# Patient Record
Sex: Female | Born: 2000 | Race: White | Hispanic: No | Marital: Single | Smoking: Never smoker
Health system: Southern US, Community
[De-identification: ages and names within clinical notes are randomized; demographics above are authoritative.]

## PROBLEM LIST (undated history)

## (undated) DIAGNOSIS — R55 Syncope and collapse: Secondary | ICD-10-CM

## (undated) HISTORY — DX: Syncope and collapse: R55

---

## 2019-04-20 ENCOUNTER — Ambulatory Visit: Payer: Self-pay | Attending: Family

## 2019-04-20 DIAGNOSIS — Z23 Encounter for immunization: Secondary | ICD-10-CM

## 2019-04-20 NOTE — Progress Notes (Signed)
   Covid-19 Vaccination Clinic  Name:  Kimberly Owens    MRN: [id number] DOB: 2000/10/23  04/20/2019  [name] was observed post Covid-19 immunization for 15 minutes without incident. She was provided with Vaccine Information Sheet and instruction to access the V-Safe system.   [name] was instructed to call 911 with any severe reactions post vaccine: Marland Kitchen Difficulty breathing  . Swelling of face and throat  . A fast heartbeat  . A bad rash all over body  . Dizziness and weakness   Immunizations Administered    Name Date Dose VIS Date Route   Moderna COVID-19 Vaccine 04/20/2019 11:45 AM 0.5 mL 12/20/2018 Intramuscular   Manufacturer: Moderna   Lot: [id number]   NDC: 59163-846-65

## 2019-05-23 ENCOUNTER — Ambulatory Visit: Payer: Self-pay | Attending: Family

## 2019-05-23 DIAGNOSIS — Z23 Encounter for immunization: Secondary | ICD-10-CM

## 2019-05-23 NOTE — Progress Notes (Signed)
   Covid-19 Vaccination Clinic  Name:  Kimberly Owens    MRN: [id number] DOB: 23-Jan-2000  05/23/2019  Ms. Kimberly Owens was observed post Covid-19 immunization for 15 minutes without incident. She was provided with Vaccine Information Sheet and instruction to access the V-Safe system.   Ms. Kimberly Owens was instructed to call 911 with any severe reactions post vaccine: Marland Kitchen Difficulty breathing  . Swelling of face and throat  . A fast heartbeat  . A bad rash all over body  . Dizziness and weakness   Immunizations Administered    Name Date Dose VIS Date Route   Moderna COVID-19 Vaccine 05/23/2019 11:28 AM 0.5 mL 12/2018 Intramuscular   Manufacturer: Moderna   Lot: [id number]   NDC: 78412-820-81

## 2020-02-08 ENCOUNTER — Emergency Department (HOSPITAL_COMMUNITY): Payer: Managed Care, Other (non HMO)

## 2020-02-08 ENCOUNTER — Other Ambulatory Visit: Payer: Self-pay

## 2020-02-08 ENCOUNTER — Encounter (HOSPITAL_COMMUNITY): Payer: Self-pay

## 2020-02-08 ENCOUNTER — Emergency Department (HOSPITAL_COMMUNITY)
Admission: EM | Admit: 2020-02-08 | Discharge: 2020-02-09 | Disposition: A | Discharge status: Home / Self Care | Payer: Managed Care, Other (non HMO) | Attending: Emergency Medicine | Admitting: Emergency Medicine

## 2020-02-08 DIAGNOSIS — R079 Chest pain, unspecified: Secondary | ICD-10-CM | POA: Diagnosis present

## 2020-02-08 DIAGNOSIS — I319 Disease of pericardium, unspecified: Secondary | ICD-10-CM

## 2020-02-08 LAB — CBC
HCT: 42.4 % (ref 36.0–46.0)
Hemoglobin: 14.3 g/dL (ref 12.0–15.0)
MCH: 30.1 pg (ref 26.0–34.0)
MCHC: 33.7 g/dL (ref 30.0–36.0)
MCV: 89.3 fL (ref 80.0–100.0)
Platelets: 365 10*3/uL (ref 150–400)
RBC: 4.75 MIL/uL (ref 3.87–5.11)
RDW: 12.4 % (ref 11.5–15.5)
WBC: 7.3 10*3/uL (ref 4.0–10.5)
nRBC: 0 % (ref 0.0–0.2)

## 2020-02-08 LAB — BASIC METABOLIC PANEL
Anion gap: 9 (ref 5–15)
BUN: 18 mg/dL (ref 6–20)
CO2: 23 mmol/L (ref 22–32)
Calcium: 9.3 mg/dL (ref 8.9–10.3)
Chloride: 104 mmol/L (ref 98–111)
Creatinine, Ser: 0.73 mg/dL (ref 0.44–1.00)
GFR, Estimated: >60 mL/min (ref >60)
Glucose, Bld: 76 mg/dL (ref 70–99)
Potassium: 3.8 mmol/L (ref 3.5–5.1)
Sodium: 136 mmol/L (ref 135–145)

## 2020-02-08 LAB — I-STAT BETA HCG BLOOD, ED (MC, WL, AP ONLY): I-stat hCG, quantitative: <5 m[IU]/mL (ref <5)

## 2020-02-08 MED ORDER — ALBUTEROL SULFATE HFA 108 (90 BASE) MCG/ACT IN AERS: 2.0000 | INHALATION_SPRAY | RESPIRATORY_TRACT | Status: DC | PRN

## 2020-02-08 MED ORDER — IOHEXOL 350 MG/ML SOLN
87.0000 mL | Freq: Once | INTRAVENOUS | Status: AC | PRN
  Administered 2020-02-08: 87 mL via INTRAVENOUS

## 2020-02-08 NOTE — ED Triage Notes (Signed)
Pt c/o chest pressure after being sick for approx 86mo, symptoms getting better/worse over timespan. Taken several covid tests, all negative. Sx include fatigue, nausea, HA, fevers, cough, runny/stuffy nose. Medicated at home using OTC medications. Pt is vaccinated.

## 2020-02-09 LAB — TROPONIN I (HIGH SENSITIVITY)
Troponin I (High Sensitivity): <2 ng/L (ref <18)
Troponin I (High Sensitivity): <2 ng/L (ref <18)

## 2020-02-09 MED ORDER — MELOXICAM 7.5 MG PO TABS: 7.5000 mg | ORAL_TABLET | Freq: Every day | ORAL | 0 refills | Status: DC

## 2020-02-09 NOTE — ED Provider Notes (Signed)
Contacted by Dr. Elvera Maria, radiologist.  Patient is currently in the waiting room.  She had a CT of her chest performed.  There is no PE seen but she does have what appears to be mediastinum.  Dr. Debria Garret is concerned that the air may be in the pericardium.  Differential diagnosis would be pneumomediastinum secondary to her persistent cough but must consider pericarditis.  EKG reviewed, no changes to suggest pericarditis.  Will add on troponins to rule out myocarditis.   EKG Interpretation  Date/Time:  Thursday February 08 2020 21:57:15 EST Ventricular Rate:  97 PR Interval:  146 QRS Duration: 88 QT Interval:  344 QTC Calculation: 436 R Axis:   57 Text Interpretation: Normal sinus rhythm Normal ECG Confirmed by Gilda Crease [phone]) on 02/09/2020 12:01:29 AM         Gilda Crease, MD 02/09/20 0003

## 2020-02-09 NOTE — ED Notes (Signed)
ED Provider at bedside. 

## 2020-02-09 NOTE — ED Provider Notes (Signed)
[facility] EMERGENCY DEPARTMENT Provider Note   CSN: [id number] Arrival date & time: 02/08/20  2147     History Chief Complaint  Patient presents with   Chest Pain    Kimberly Owens is a 20 y.o. female.  Has had a URI type illness for the last couple weeks.  She had on and off again symptoms that started doing better.  Started persistent cough and runny nose.  She yesterday she had acute onset of chest pain hasn't gotten any better.  She elevated shortness of breath a few days ago that comes and goes as well.  She has been afebrile recently.  Her mother stated that she should get checked out for the chest pain.  She had multiple COVID test that were negative.  No other associated symptoms.  No lower extremity swelling.  No syncope.   Chest Pain      History reviewed. No pertinent past medical history.  There are no problems to display for this patient.   History reviewed. No pertinent surgical history.   OB History   No obstetric history on file.     No family history on file.     Home Medications Prior to Admission medications   Medication Sig Start Date End Date Taking? Authorizing Provider  meloxicam (MOBIC) 7.5 MG tablet Take 1 tablet (7.5 mg total) by mouth daily. 02/09/20  Yes Tanner Vigna, Barbara Cower, MD    Allergies    Patient has no allergy information on record.  Review of Systems   Review of Systems  Cardiovascular: Positive for chest pain.    Physical Exam Updated Vital Signs BP 122/80    Pulse 77    Temp 98.2 F (36.8 C) (Oral)    Resp 20    SpO2 99%   Physical Exam Vitals and nursing note reviewed.  Constitutional:      Appearance: She is well-developed and well-nourished.  HENT:     Head: Normocephalic and atraumatic.     Nose: No congestion or rhinorrhea.     Mouth/Throat:     Mouth: Mucous membranes are moist.     Pharynx: Oropharynx is clear.  Eyes:     Pupils: Pupils are equal, round, and reactive to light.   Cardiovascular:     Rate and Rhythm: Normal rate and regular rhythm.  Pulmonary:     Effort: Pulmonary effort is normal. No respiratory distress.     Breath sounds: No stridor.  Abdominal:     General: Abdomen is flat. There is no distension.  Musculoskeletal:     Cervical back: Normal range of motion.  Skin:    General: Skin is warm and dry.     Coloration: Skin is not jaundiced or pale.  Neurological:     General: No focal deficit present.     Mental Status: She is alert.     ED Results / Procedures / Treatments   Labs (all labs ordered are listed, but only abnormal results are displayed) Labs Reviewed  BASIC METABOLIC PANEL  CBC  I-STAT BETA HCG BLOOD, ED ([facility], [facility], [facility] ONLY)  TROPONIN I (HIGH SENSITIVITY)  TROPONIN I (HIGH SENSITIVITY)    EKG EKG Interpretation  Date/Time:  Thursday February 08 2020 21:57:15 EST Ventricular Rate:  97 PR Interval:  146 QRS Duration: 88 QT Interval:  344 QTC Calculation: 436 R Axis:   57 Text Interpretation: Normal sinus rhythm Normal ECG Confirmed by Gilda Crease [phone]) on 02/09/2020 12:01:29 AM   Radiology DG  Chest 2 View  Addendum Date: 02/08/2020   ADDENDUM REPORT: 02/08/2020 22:17 ADDENDUM: Critical Value/emergent results were called by telephone at the time of physician contact on 02/08/2020 at 10:17 pm to provider ADAM CURATOLO , who verbally acknowledged these results. Electronically Signed   By: Kreg Shropshire M.D.   On: 02/08/2020 22:17   Result Date: 02/08/2020 CLINICAL DATA:  Chest pressure after illness for 1 month EXAM: CHEST - 2 VIEW COMPARISON:  None. FINDINGS: No consolidation, features of edema, pneumothorax, or effusion. Lucency along the left heart border concerning for small volume pneumopericardium, indeterminate etiology. Remaining cardiomediastinal contours are unremarkable. No acute osseous or soft tissue abnormality. IMPRESSION: Lucency along the left heart border concerning for small volume  pneumopericardium, indeterminate etiology. Recommend correlation with chest CT. Electronically Signed: By: Kreg Shropshire M.D. On: 02/08/2020 22:14   CT Angio Chest PE W and/or Wo Contrast  Addendum Date: 02/09/2020   ADDENDUM REPORT: 02/09/2020 00:01 ADDENDUM: These results were called by telephone on 02/09/2020 at 12:00 am to provider Dr. Blinda Leatherwood, who verbally acknowledged these results. Electronically Signed   By: Kreg Shropshire M.D.   On: 02/09/2020 00:01   Result Date: 02/09/2020 CLINICAL DATA:  Pneumopericardium, 1 month of generalized illness, multiple negative COVID-19 tests is EXAM: CT ANGIOGRAPHY CHEST WITH CONTRAST TECHNIQUE: Multidetector CT imaging of the chest was performed using the standard protocol during bolus administration of intravenous contrast. Multiplanar CT image reconstructions and MIPs were obtained to evaluate the vascular anatomy. CONTRAST:  26mL OMNIPAQUE IOHEXOL 350 MG/ML SOLN COMPARISON:  Radiograph 02/08/2020 FINDINGS: Cardiovascular: Satisfactory opacification the pulmonary arteries to the segmental level. No pulmonary artery filling defects are identified. Central pulmonary arteries are normal caliber. Air lucencies are noted in the anti dependent portions of pericardium and pericardial recesses. Few in turn a thin septations are present as well. No large volume pericardial effusion. No other acute cardiac abnormality is seen. No acute luminal abnormality of the imaged aorta. No periaortic stranding or hemorrhage. Mediastinum/Nodes: Wedge-shaped soft tissue in the anterior mediastinum, favored to reflect thymic remnant, appears separate the pericardial process. Gas appears contained within the pericardium rather than freely within the mediastinum. No acute abnormality of the esophagus or trachea. No concerning mediastinal mild hilar or axillary adenopathy. Lungs/Pleura: No consolidation, features of edema, pneumothorax, or effusion. No suspicious pulmonary nodules or masses. Upper  Abdomen: No acute abnormalities present in the visualized portions of the upper abdomen. Musculoskeletal: No acute osseous abnormality or suspicious osseous lesion. No chest wall mass or suspicious bone lesions identified. Review of the MIP images confirms the above findings. IMPRESSION: 1. No evidence of pulmonary artery embolism. 2. Air lucencies in the anti dependent portions of pericardium and pericardial recesses. Few thin internal septations may be present as well with at most trace pericardial fluid. While this could reflect spontaneous pneumopericardium which can be seen in the setting of respiratory illness and aggressive coughing as the result of a Macklin effect, pericarditis/pericardial infection should be excluded even though this would be an atypical presentation. 3. Wedge-shaped soft tissue in the anterior mediastinum, favored to reflect thymic remnant, appears separate the pericardial process. Currently attempting to contact the ordering provider with a critical value result. Addendum will be submitted upon case discussion. Electronically Signed: By: Kreg Shropshire M.D. On: 02/08/2020 23:55    Procedures Procedures (including critical care time)  Medications Ordered in ED Medications  albuterol (VENTOLIN HFA) 108 (90 Base) MCG/ACT inhaler 2 puff (has no administration in time range)  iohexol (OMNIPAQUE)  350 MG/ML injection 87 mL (87 mLs Intravenous Contrast Given 02/08/20 2339)    ED Course  I have reviewed the triage vital signs and the nursing notes.  Pertinent labs & imaging results that were available during my care of the patient were reviewed by me and considered in my medical decision making (see chart for details).    MDM Rules/Calculators/A&P                          Found a pneumomediastinum and pneumopericardium.  This is likely related to forceful coughing.  I discussed with cardiothoracic surgery, Dr. Renaldo Fiddler, who stated if there is no other indication for admission to  the hospital that she can be followed up by primary doctor.  If any worsening symptoms then she can return here.  Did not suggest antibiotics.  Patient's vital signs are within normal limits.  Her lungs are clear.  Her heart sounds are clear.  I don't see any reason for admission to the hospital at this time.  We'll start her on some anti-inflammatories have her follow-up with her PCP.  Discussed return precautions.  Final Clinical Impression(s) / ED Diagnoses Final diagnoses:  Pneumopericardium    Rx / DC Orders ED Discharge Orders         Ordered    meloxicam (MOBIC) 7.5 MG tablet  Daily        02/09/20 0236           Manna Gose, Barbara Cower, MD 02/09/20 [phone]

## 2020-06-04 ENCOUNTER — Ambulatory Visit (HOSPITAL_COMMUNITY)
Admission: EM | Admit: 2020-06-04 | Discharge: 2020-06-04 | Disposition: A | Discharge status: Other Acute Inpatient Hospital | Payer: Managed Care, Other (non HMO)

## 2020-06-04 ENCOUNTER — Encounter (HOSPITAL_COMMUNITY): Payer: Self-pay | Admitting: Emergency Medicine

## 2020-06-04 ENCOUNTER — Other Ambulatory Visit: Payer: Self-pay

## 2020-06-04 ENCOUNTER — Emergency Department (HOSPITAL_COMMUNITY)
Admission: EM | Admit: 2020-06-04 | Discharge: 2020-06-05 | Disposition: A | Discharge status: Home / Self Care | Payer: Managed Care, Other (non HMO) | Attending: Emergency Medicine | Admitting: Emergency Medicine

## 2020-06-04 DIAGNOSIS — R011 Cardiac murmur, unspecified: Secondary | ICD-10-CM | POA: Diagnosis not present

## 2020-06-04 DIAGNOSIS — R55 Syncope and collapse: Secondary | ICD-10-CM | POA: Insufficient documentation

## 2020-06-04 LAB — CBC
HCT: 46.4 % — ABNORMAL HIGH (ref 36.0–46.0)
Hemoglobin: 15.6 g/dL — ABNORMAL HIGH (ref 12.0–15.0)
MCH: 31.3 pg (ref 26.0–34.0)
MCHC: 33.6 g/dL (ref 30.0–36.0)
MCV: 93.2 fL (ref 80.0–100.0)
Platelets: 385 10*3/uL (ref 150–400)
RBC: 4.98 MIL/uL (ref 3.87–5.11)
RDW: 12.7 % (ref 11.5–15.5)
WBC: 15.2 10*3/uL — ABNORMAL HIGH (ref 4.0–10.5)
nRBC: 0 % (ref 0.0–0.2)

## 2020-06-04 LAB — URINALYSIS, ROUTINE W REFLEX MICROSCOPIC
Bilirubin Urine: NEGATIVE
Glucose, UA: NEGATIVE mg/dL
Hgb urine dipstick: NEGATIVE
Ketones, ur: NEGATIVE mg/dL
Leukocytes,Ua: NEGATIVE
Nitrite: NEGATIVE
Protein, ur: NEGATIVE mg/dL
Specific Gravity, Urine: 1.019 (ref 1.005–1.030)
pH: 6 (ref 5.0–8.0)

## 2020-06-04 LAB — I-STAT BETA HCG BLOOD, ED (MC, WL, AP ONLY): I-stat hCG, quantitative: <5 m[IU]/mL (ref <5)

## 2020-06-04 LAB — COMPREHENSIVE METABOLIC PANEL
ALT: 21 U/L (ref 0–44)
AST: 22 U/L (ref 15–41)
Albumin: 4.1 g/dL (ref 3.5–5.0)
Alkaline Phosphatase: 59 U/L (ref 38–126)
Anion gap: 6 (ref 5–15)
BUN: 11 mg/dL (ref 6–20)
CO2: 29 mmol/L (ref 22–32)
Calcium: 9.6 mg/dL (ref 8.9–10.3)
Chloride: 104 mmol/L (ref 98–111)
Creatinine, Ser: 0.86 mg/dL (ref 0.44–1.00)
GFR, Estimated: >60 mL/min (ref >60)
Glucose, Bld: 100 mg/dL — ABNORMAL HIGH (ref 70–99)
Potassium: 4.4 mmol/L (ref 3.5–5.1)
Sodium: 139 mmol/L (ref 135–145)
Total Bilirubin: 1.1 mg/dL (ref 0.3–1.2)
Total Protein: 7.1 g/dL (ref 6.5–8.1)

## 2020-06-04 NOTE — ED Triage Notes (Signed)
Pt was at the nail salon when she had a one minute episode of loc sitting in salon chair. First time this has ever happened. Pt woke up confused, nauseous and drenched in sweat with pins and needles all over body. Pt denies any pain. No injuries.

## 2020-06-04 NOTE — ED Notes (Signed)
Patient reports she was sitting at a nail salon having nails done when witness reported she passed out.  Patient did not hit the floor or any other surface.  Patient denies any issue. Patient denies a history of this.  Patient denies any recent illness, states she has eaten today, no significant work out today.  Patient has a friend with her.  Spoke to dr Dareen Piano.  Dr Dareen Piano spoke to patient and patient agreeable to going to ed.  Dr Dareen Piano did call ED to notify of coming

## 2020-06-04 NOTE — ED Provider Notes (Signed)
Emergency Medicine Provider Triage Evaluation Note  Kimberly Owens , a 20 y.o. female  was evaluated in triage.  Pt complains of syncope.  On 415, patient had a syncopal event although now small.  She was sitting down, feeling well with no preceding symptoms.  When she woke up, she was sweaty, nauseous, and tingly all over.  The symptoms have improved, but not completely resolved.  No previous history of syncope.  No family history of syncope with exertion.  She has no medical problems other than anxiety, takes medicine for this.  That medicine is not new nor recently changed.  She did not have any fevers, chest pain, shortness breath, cough, nausea, vomiting, abdominal pain, urinary symptoms, abnormal bowel movements.  Last period was a few weeks ago.  She has had food and drink today like normal. Patient was in the ER in January 2022 and diagnosed with pneumopericardium and pneumomediastinum.  She has had no residual chest pain, shortness of breath, cough from this.  Review of Systems  Positive: syncope Negative: Fever, cp  Physical Exam  BP 118/70 (BP Location: Left Arm)   Pulse (!) 58   Temp 97.9 F (36.6 C)   Resp 16   SpO2 100%  Gen:   Awake, no distress   Resp:  Normal effort, clear lung sounds  MSK:   Moves extremities without difficulty  Other:  No TTP of abdomen.  Regular rate rhythm.  Medical Decision Making  Medically screening exam initiated at 5:12 PM.  Appropriate orders placed.  Kimberly Owens was informed that the remainder of the evaluation will be completed by another provider, this initial triage assessment does not replace that evaluation, and the importance of remaining in the ED until their evaluation is complete.  Labs, EKG, urine ordered   Kimberly Apley, PA-C 06/04/20 1723    Blane Ohara, MD 06/05/20 [phone]

## 2020-06-04 NOTE — ED Notes (Signed)
Patient is being discharged from the Urgent Care and sent to the Emergency Department via pov . Per dr Dareen Piano, patient is in need of higher level of care due to syncope. Patient is aware and verbalizes understanding of plan of care. There were no vitals filed for this visit.

## 2020-06-05 ENCOUNTER — Emergency Department (HOSPITAL_COMMUNITY): Payer: Managed Care, Other (non HMO)

## 2020-06-05 MED ORDER — ACETAMINOPHEN 500 MG PO TABS
1000.0000 mg | ORAL_TABLET | Freq: Once | ORAL | Status: AC
  Administered 2020-06-05: 1000 mg via ORAL
  Filled 2020-06-05: qty 2

## 2020-06-05 NOTE — ED Provider Notes (Signed)
[facility] EMERGENCY DEPARTMENT Provider Note   CSN: [id number] Arrival date & time: 06/04/20  1658     History Chief Complaint  Patient presents with  . Loss of Consciousness    Kimberly Owens is a 20 y.o. female.  20 year old female without significant past medical history who presents to the emergency department today secondary to syncope.  Patient was apparently getting her fingernails and toenails painted when she had a syncopal episode.  The people there caught her and she did not fall to the ground.  They state that she was out for approximately 60 seconds but then when she awoke she felt little bit cool clammy and nauseous.  No other associated symptoms.  No preceding chest pain, palpitations, headache, blurry vision.  She is been eating and drinking normally recently.  Normal menstrual cycles.  No family history of early death or other cardiac issues.        History reviewed. No pertinent past medical history.  There are no problems to display for this patient.   History reviewed. No pertinent surgical history.   OB History   No obstetric history on file.     No family history on file.  Social History   Tobacco Use  . Smoking status: Never Smoker  . Smokeless tobacco: Never Used  Substance Use Topics  . Alcohol use: Never  . Drug use: Never    Home Medications Prior to Admission medications   Medication Sig Start Date End Date Taking? Authorizing Provider  FLUoxetine (PROZAC) 10 MG capsule Take 10 mg by mouth daily. Patient not taking: Reported on 06/05/2020 05/23/20   [provider]    Allergies    Patient has no known allergies.  Review of Systems   Review of Systems  All other systems reviewed and are negative.   Physical Exam Updated Vital Signs BP 114/66 (BP Location: Right Arm)   Pulse (!) 59   Temp (!) 97 F (36.1 C) (Temporal)   Resp 18   Ht 5\' 10"  (1.778 m)   Wt 74.8 kg   LMP 05/05/2020   SpO2 100%   BMI  23.68 kg/m   Physical Exam Vitals and nursing note reviewed.  Constitutional:      Appearance: She is well-developed.  HENT:     Head: Normocephalic and atraumatic.     Mouth/Throat:     Mouth: Mucous membranes are moist.     Pharynx: Oropharynx is clear.  Eyes:     Pupils: Pupils are equal, round, and reactive to light.  Cardiovascular:     Rate and Rhythm: Normal rate and regular rhythm.     Heart sounds: Murmur heard.    Pulmonary:     Effort: No respiratory distress.     Breath sounds: No stridor.  Abdominal:     General: Abdomen is flat. There is no distension.  Musculoskeletal:        General: No swelling, tenderness or signs of injury. Normal range of motion.     Cervical back: Normal range of motion.  Skin:    General: Skin is warm and dry.  Neurological:     General: No focal deficit present.     Mental Status: She is alert.     ED Results / Procedures / Treatments   Labs (all labs ordered are listed, but only abnormal results are displayed) Labs Reviewed  CBC - Abnormal; Notable for the following components:      Result Value   WBC  15.2 (*)    Hemoglobin 15.6 (*)    HCT 46.4 (*)    All other components within normal limits  COMPREHENSIVE METABOLIC PANEL - Abnormal; Notable for the following components:   Glucose, Bld 100 (*)    All other components within normal limits  URINALYSIS, ROUTINE W REFLEX MICROSCOPIC  I-STAT BETA HCG BLOOD, ED ([facility], [facility], [facility] ONLY)  CBG MONITORING, ED    EKG EKG Interpretation  Date/Time:  Tuesday Jun 04 2020 17:05:18 EDT Ventricular Rate:  61 PR Interval:  134 QRS Duration: 90 QT Interval:  384 QTC Calculation: 386 R Axis:   93 Text Interpretation: Normal sinus rhythm Rightward axis Borderline ECG Right axis new since january Confirmed by Marily Memos [phone]) on 06/05/2020 2:01:41 AM   Radiology CT Head Wo Contrast  Result Date: 06/05/2020 CLINICAL DATA:  Cerebral hemorrhage suspected EXAM: CT HEAD WITHOUT CONTRAST  TECHNIQUE: Contiguous axial images were obtained from the base of the skull through the vertex without intravenous contrast. COMPARISON:  None. FINDINGS: Brain: No evidence of acute infarction, hemorrhage, hydrocephalus, extra-axial collection or mass lesion/mass effect. Vascular: No hyperdense vessel or unexpected calcification. Skull: Normal. Negative for fracture or focal lesion. Sinuses/Orbits: No acute finding. Other: None. IMPRESSION: No acute intracranial pathology. Electronically Signed   By: Maudry Mayhew MD   On: 06/05/2020 03:32    Procedures Procedures   Medications Ordered in ED Medications  acetaminophen (TYLENOL) tablet 1,000 mg (1,000 mg Oral Given 06/05/20 0228)    ED Course  I have reviewed the triage vital signs and the nursing notes.  Pertinent labs & imaging results that were available during my care of the patient were reviewed by me and considered in my medical decision making (see chart for details).    MDM Rules/Calculators/A&P                          Will refer to cardiology.  Work-up here is overall unremarkable however she does appear to have a new right axis deviation on her EKG.  She has no chest pain, shortness of breath, tachycardia, tachypnea, hypoxia or other signs of pulmonary embolus.  It does seem that she has a very slight murmur which would be new.  This conjunction with the syncope I feel she needs cardiology follow-up for echocardiogram to rule out any structural causes however the syncope did sound vasovagal in nature.  She had a headache at time of evaluation so CT done without any abnormalities.  Tylenol given.  Stable for discharge with cardiology follow-up.  All questions answered.  Strict return precautions.  Final Clinical Impression(s) / ED Diagnoses Final diagnoses:  Syncope, unspecified syncope type  Heart murmur    Rx / DC Orders ED Discharge Orders         Ordered    Ambulatory referral to Cardiology        06/05/20 0341            Shalina Norfolk, Barbara Cower, MD 06/05/20 [phone]

## 2020-06-05 NOTE — ED Notes (Signed)
Provider bedside.

## 2020-06-05 NOTE — ED Notes (Addendum)
Pt back from CT, ambulated to bathroom w/o any issues at this time.

## 2020-08-20 ENCOUNTER — Encounter: Payer: Self-pay | Admitting: Cardiovascular Disease

## 2020-08-20 NOTE — Progress Notes (Signed)
This encounter was created in error - please disregard.

## 2020-08-21 ENCOUNTER — Encounter: Payer: Managed Care, Other (non HMO) | Admitting: Cardiovascular Disease

## 2022-09-23 IMAGING — CT CT HEAD W/O CM
4 series · 17 of 47 positions shown, 19 images · non-contrast
Comparison: None.

CLINICAL DATA: Cerebral hemorrhage suspected

EXAM:
CT HEAD WITHOUT CONTRAST
TECHNIQUE: Contiguous axial images were obtained from the base of the skull
through the vertex without intravenous contrast.

[Series 3: head bone · axial · 0.45mm/px · z∈[-153,-95]mm · 4 of 85 slices shown]
[im 9/85  bone]
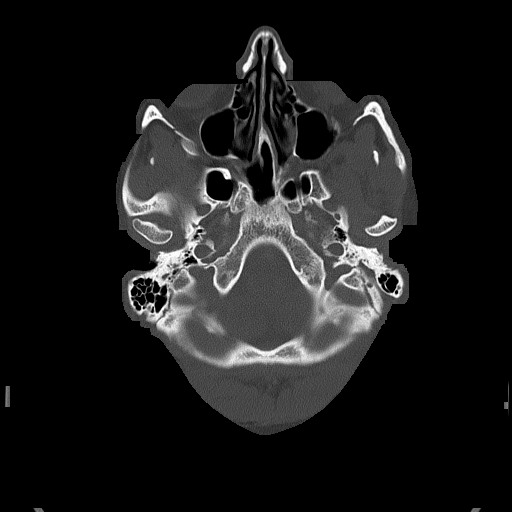
[im 17/85  bone]
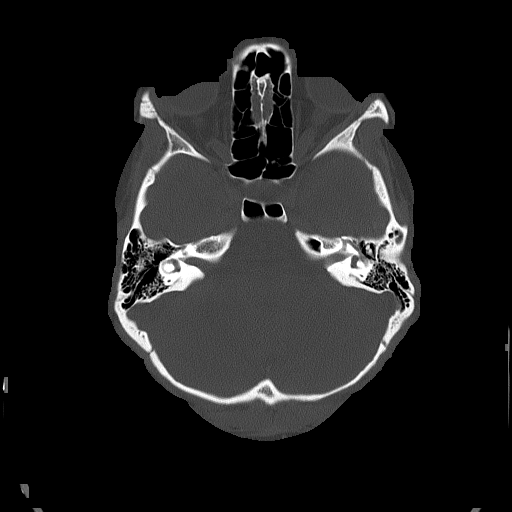
[im 26/85  bone]
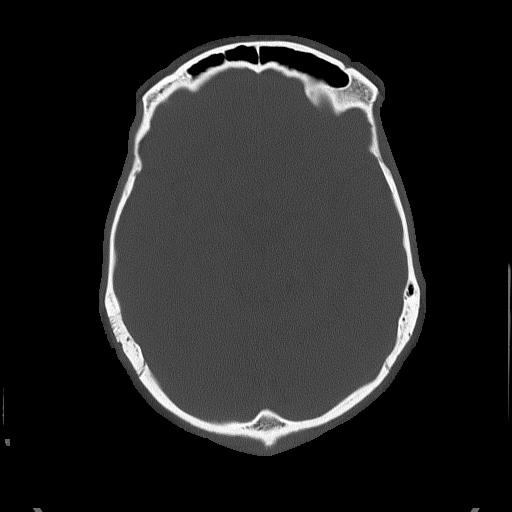
[im 38/85  bone]
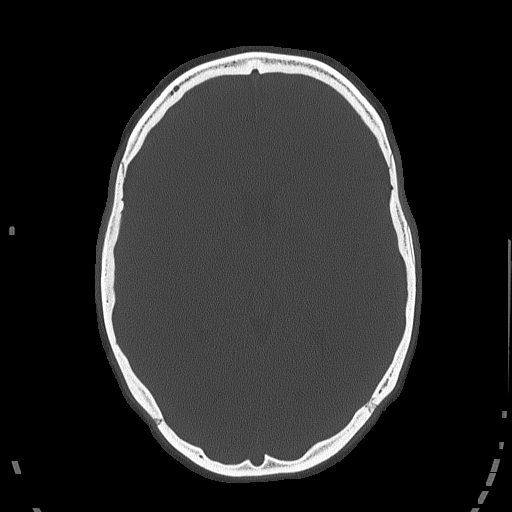

[Series 4: head without · axial · non-contrast · 0.45mm/px · z∈[-149,-29]mm · 7 of 34 slices shown, 9 images]
[im 5/34  brain]
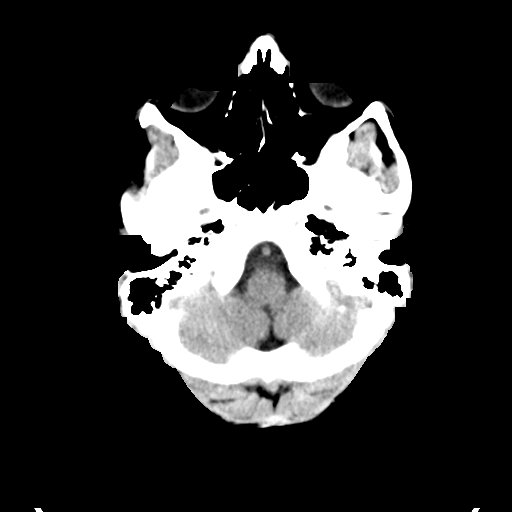
[im 5/34  bone]
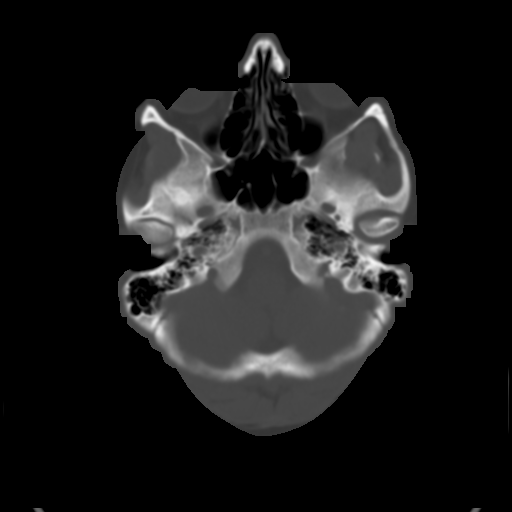
[im 9/34  brain]
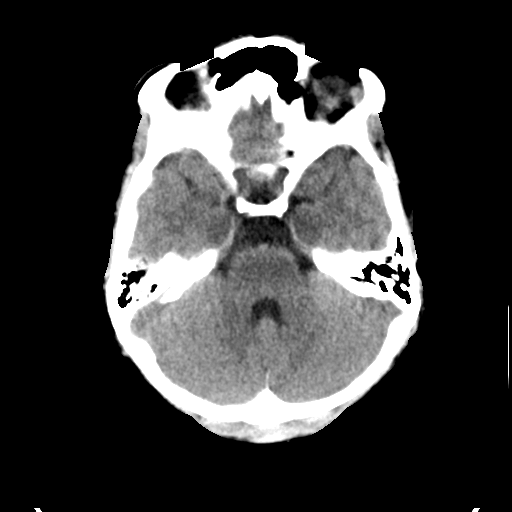
[im 13/34  brain]
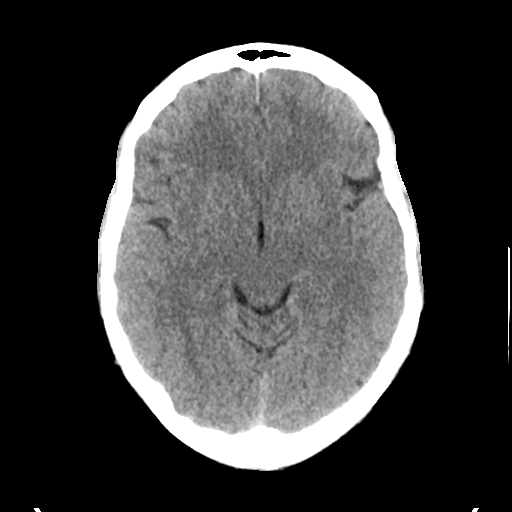
[im 17/34  brain]
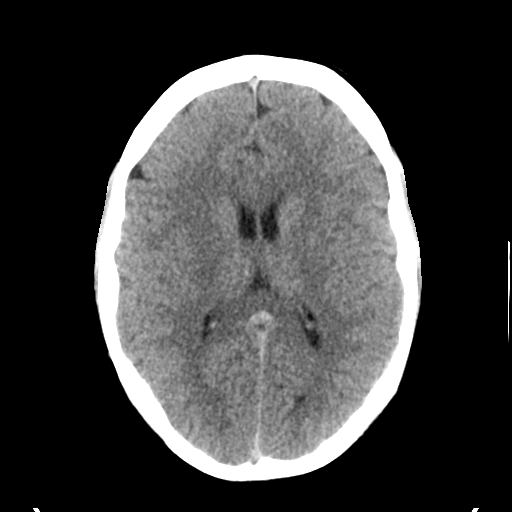
[im 21/34  brain]
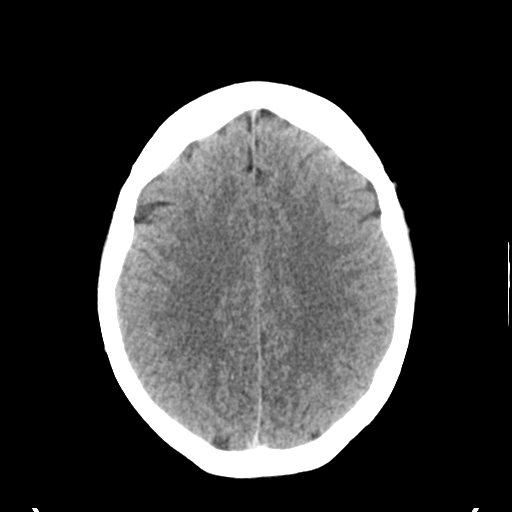
[im 21/34  bone]
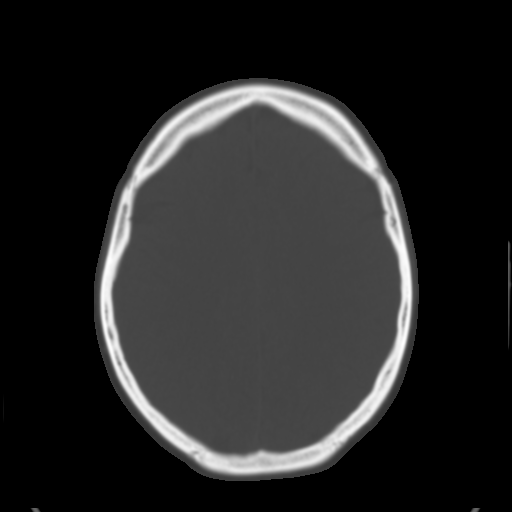
[im 25/34  brain]
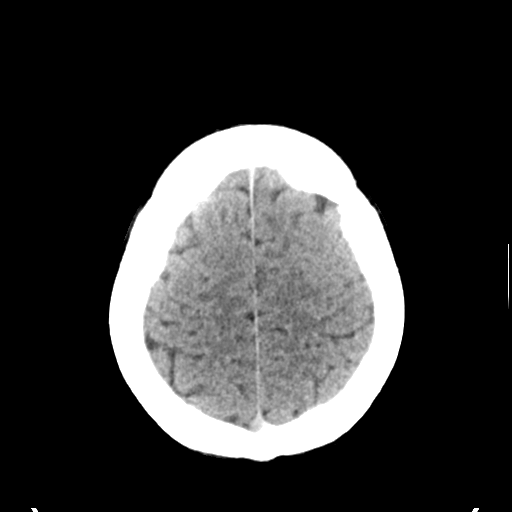
[im 29/34  brain]
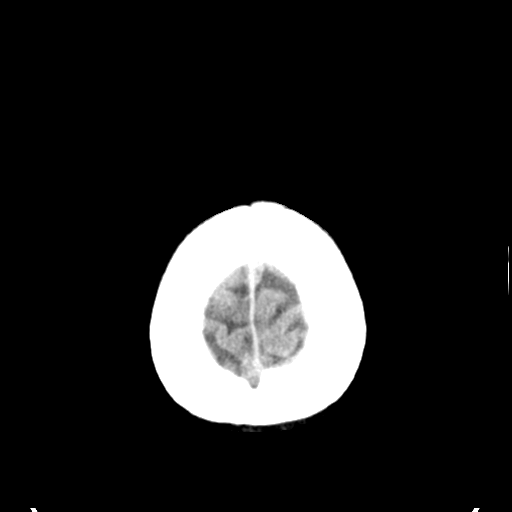

[Series 5: head without cor · coronal · non-contrast · 0.36mm/px · 3 of 67 slices shown]
[im 23/67  brain]
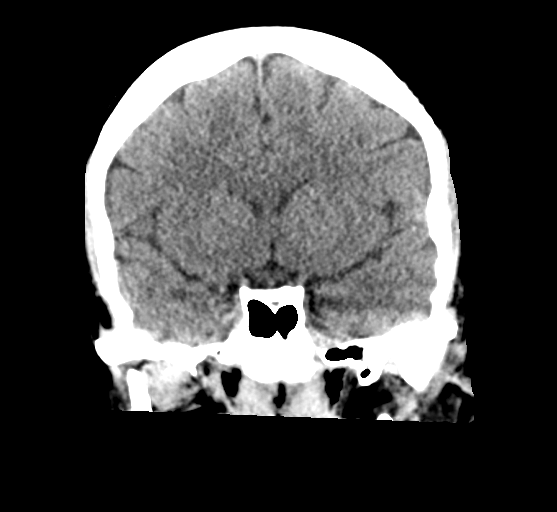
[im 30/67  brain]
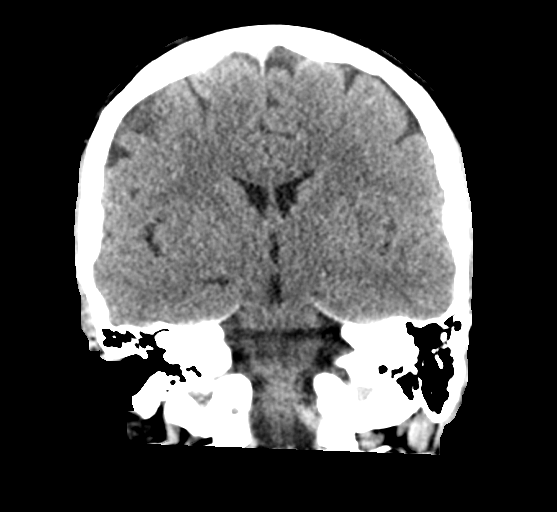
[im 37/67  brain]
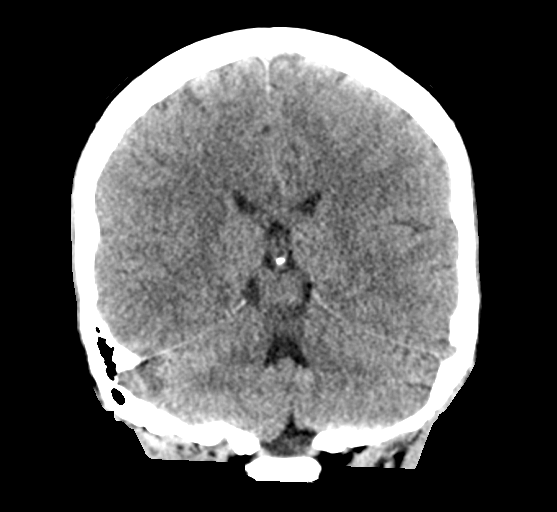

[Series 6: head without sag · sagittal · non-contrast · 0.39mm/px · 3 of 66 slices shown]
[im 22/66  brain]
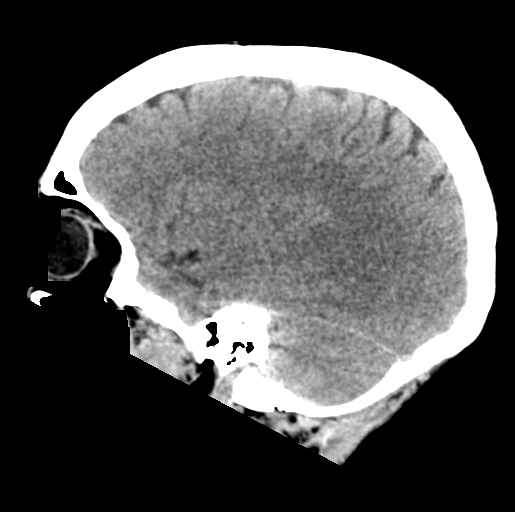
[im 33/66  brain]
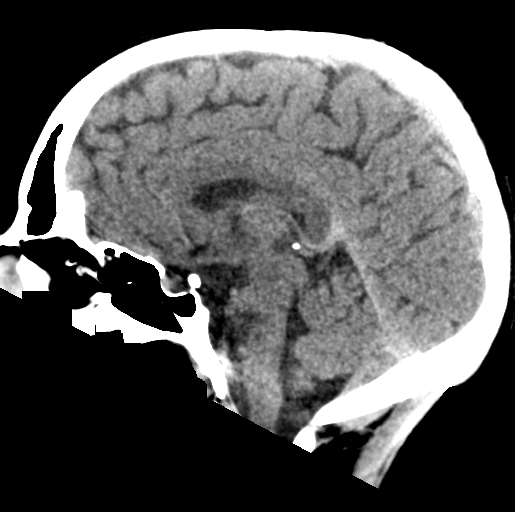
[im 44/66  brain]
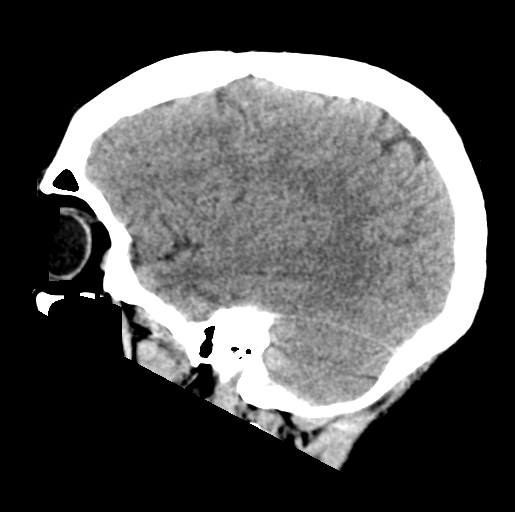

[17 of 47 positions shown; findings below may reference images not displayed]

FINDINGS: Brain: No evidence of acute infarction, hemorrhage, hydrocephalus,
extra-axial collection or mass lesion/mass effect.

Vascular: No hyperdense vessel or unexpected calcification.

Skull: Normal. Negative for fracture or focal lesion.

Sinuses/Orbits: No acute finding.

Other: None.
IMPRESSION: No acute intracranial pathology.
